# Patient Record
Sex: Female | Born: 1980 | Race: White | Hispanic: No | Marital: Married
Health system: Southern US, Community
[De-identification: ages and names within clinical notes are randomized; demographics above are authoritative.]

---

## 2015-05-17 ENCOUNTER — Other Ambulatory Visit (HOSPITAL_COMMUNITY): Payer: Self-pay | Admitting: Unknown Physician Specialty

## 2015-05-17 DIAGNOSIS — O1210 Gestational proteinuria, unspecified trimester: Secondary | ICD-10-CM

## 2015-05-18 ENCOUNTER — Encounter (HOSPITAL_COMMUNITY): Payer: Self-pay | Admitting: Unknown Physician Specialty

## 2015-05-25 ENCOUNTER — Other Ambulatory Visit (HOSPITAL_COMMUNITY): Payer: Self-pay | Admitting: Unknown Physician Specialty

## 2015-05-25 DIAGNOSIS — O1210 Gestational proteinuria, unspecified trimester: Secondary | ICD-10-CM

## 2015-05-25 DIAGNOSIS — O09512 Supervision of elderly primigravida, second trimester: Secondary | ICD-10-CM

## 2015-05-25 DIAGNOSIS — Z3A19 19 weeks gestation of pregnancy: Secondary | ICD-10-CM

## 2015-05-25 DIAGNOSIS — Z3689 Encounter for other specified antenatal screening: Secondary | ICD-10-CM

## 2015-05-26 ENCOUNTER — Encounter (HOSPITAL_COMMUNITY): Payer: Self-pay

## 2015-05-26 ENCOUNTER — Other Ambulatory Visit (HOSPITAL_COMMUNITY): Payer: Self-pay | Admitting: *Deleted

## 2015-05-26 ENCOUNTER — Ambulatory Visit (HOSPITAL_COMMUNITY)
Admission: RE | Admit: 2015-05-26 | Discharge: 2015-05-26 | Disposition: A | Discharge status: Home / Self Care | Payer: BLUE CROSS/BLUE SHIELD | Source: Ambulatory Visit | Attending: Unknown Physician Specialty | Admitting: Unknown Physician Specialty

## 2015-05-26 ENCOUNTER — Other Ambulatory Visit (HOSPITAL_COMMUNITY): Payer: Self-pay | Admitting: Obstetrics and Gynecology

## 2015-05-26 DIAGNOSIS — O09292 Supervision of pregnancy with other poor reproductive or obstetric history, second trimester: Secondary | ICD-10-CM

## 2015-05-26 DIAGNOSIS — O1210 Gestational proteinuria, unspecified trimester: Secondary | ICD-10-CM

## 2015-05-26 DIAGNOSIS — Z315 Encounter for genetic counseling: Secondary | ICD-10-CM | POA: Insufficient documentation

## 2015-05-26 DIAGNOSIS — Z3A19 19 weeks gestation of pregnancy: Secondary | ICD-10-CM | POA: Insufficient documentation

## 2015-05-26 DIAGNOSIS — Z36 Encounter for antenatal screening of mother: Secondary | ICD-10-CM | POA: Insufficient documentation

## 2015-05-26 DIAGNOSIS — O09529 Supervision of elderly multigravida, unspecified trimester: Secondary | ICD-10-CM | POA: Insufficient documentation

## 2015-05-26 DIAGNOSIS — O09512 Supervision of elderly primigravida, second trimester: Secondary | ICD-10-CM

## 2015-05-26 DIAGNOSIS — O99210 Obesity complicating pregnancy, unspecified trimester: Secondary | ICD-10-CM

## 2015-05-26 DIAGNOSIS — O09522 Supervision of elderly multigravida, second trimester: Secondary | ICD-10-CM | POA: Diagnosis not present

## 2015-05-26 DIAGNOSIS — O34219 Maternal care for unspecified type scar from previous cesarean delivery: Secondary | ICD-10-CM | POA: Diagnosis not present

## 2015-05-26 DIAGNOSIS — O99212 Obesity complicating pregnancy, second trimester: Secondary | ICD-10-CM | POA: Diagnosis not present

## 2015-05-26 DIAGNOSIS — Z3689 Encounter for other specified antenatal screening: Secondary | ICD-10-CM

## 2015-05-26 MED ORDER — ASPIRIN EC 81 MG PO TBEC: 81.0000 mg | DELAYED_RELEASE_TABLET | Freq: Every day | ORAL | Status: AC

## 2015-05-26 NOTE — Progress Notes (Signed)
Genetic Counseling  High-Risk Gestation Note  Appointment Date:  05/26/2015 Referred By: Ernestina Penna, MD Date of Birth:  [birth year] Partner:  Tara Russell   Pregnancy History: [id number] Estimated Date of Delivery: 10/14/15 Estimated Gestational Age: [redacted]w[redacted]d Attending: Damaris Hippo, MD    Tara Russell, and her husband, Tara Russell, were seen for genetic counseling regarding a maternal age of 35 y.o..  In Summary:   Reviewed maternal age-related risk for fetal aneuploidy  Quad screen previously performed and within normal limits  Patient declined NIPS and amniocentesis  Detailed ultrasound performed today; Visualized fetal anatomy within normal limits. Complete report under separate cover  Family history significant for congenital heart disease for father of the pregnancy's maternal half-sister; Recurrence risk is approximately general population risk for the pregnancy given the reported family history.   MFM consultation today; See separate MFM note  They were counseled regarding maternal age and the association with risk for chromosome conditions due to nondisjunction with aging of the ova.   We reviewed chromosomes, nondisjunction, and the associated 1 in 111 risk for fetal aneuploidy related to a maternal age of 35 y.o. at [redacted]w[redacted]d weeks gestation.  They were counseled that the risk for aneuploidy decreases as gestational age increases, accounting for those pregnancies which spontaneously abort.  We specifically discussed Down syndrome (trisomy 62), trisomies 41 and 40, and sex chromosome aneuploidies (47,XXX and 47,XXY) including the common features and prognoses of each.   We also reviewed Tara Russell's maternal serum Quad screen result and the associated reduction in risks for fetal Down syndrome (1 in 1,416), trisomy 18, and ONTDs (1 in 10,000).  They understand that Quad screening provides a pregnancy specific risk for Down syndrome, but is not considered to  be diagnostic.  They were counseled regarding other available screening and diagnostic options including ultrasound, noninvasive prenatal screening (NIPS)/cell free DNA testing, and amniocentesis.  The risks, benefits, and limitations of each of these options were briefly reviewed.    Detailed ultrasound was performed today. Visualized fetal anatomy was within normal limits. Complete ultrasound results reported separately. Tara Russell declined NIPS and amniocentesis, stating she was not interested in additional testing for chromosome conditions during the pregnancy. They understand that screening tests cannot rule out all birth defects or genetic syndromes.  The patient was advised of this limitation and states she still does not want diagnostic testing at this time.   Tara Russell was provided with written information regarding cystic fibrosis (CF) including the carrier frequency and incidence in the Caucasian population, the availability of carrier testing and prenatal diagnosis if indicated.  In addition, we discussed that CF is routinely screened for as part of the [place] newborn screening panel.  She declined CF testing today.   Both family histories were reviewed and found to be contributory for congenital heart disease for the father of the pregnancy's maternal half-sister. 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 She is currently 66 years old, has one child, and is reportedly healthy. Tara Russell reported that his mother smoked during pregnancy with his sister and also worked at a [employer], environmental, so he was unsure what potential exposures may be have been present during that pregnancy. Congenital heart defects occur in approximately 1% of pregnancies.  Congenital heart defects may occur due to multifactorial influences, chromosomal abnormalities, genetic syndromes or environmental exposures.  Isolated heart defects are generally multifactorial.  Given the reported  family history and assuming multifactorial inheritance,  the risk for a congenital heart defect in the current pregnancy (a third degree relative) does not appear to be increased above the general population risk. Without further information regarding the provided family history, an accurate genetic risk cannot be calculated. Further genetic counseling is warranted if more information is obtained.  Tara Russell denied exposure to environmental toxins or chemical agents. She denied the use of alcohol, tobacco or street drugs. She denied significant viral illnesses during the course of her pregnancy. Her medical and surgical histories were contributory for proteinuria in the pregnancy. She was seen for MFM consultation today to discuss this history. See separate MFM consult note for detailed discussion.    I counseled this couple regarding the above risks and available options.  The approximate face-to-face time with the genetic counselor was 40 minutes. Most of the counseling was provided by Azucena Freed, [employer] genetic counseling student, under my direct supervision.   Tara Plowman, MS Certified Genetic Counselor 05/26/2015

## 2015-05-26 NOTE — Progress Notes (Signed)
MFM consultation, Staff Note:  ? Hx of preeclampsia: I spoke to her regarding her pregnancy history of preeclampsia. I explained to her that her obstetrical history places her at increased risk for recurrence of preeclampsia. I explained the syndrome of preeclampsia as being unique to pregnancy and the associated clinical triad of increased blood pressure, proteinuria and abnormal edema. I also reviewed the underlying pathophysiology of preeclampsia as being rooted in endothelial dysfunction. I explained to her the increased vascular reactivity as well as the leaky endothelial lining of the blood vessels that create an environment of uteroplacental insufficiency with increased risk of intrauterine growth restriction, oligohydramnios and stillbirth. I cited at least a 25% recurrence risk if not higher to her for the spectrum of severe preeclampsia.  Given her baseline 24 hour urine for protein and creatinine clearance demonstrating  proteinuria, she likely has a persistent microvascular changes that would be seen on renal biopsy that resulted from preeclampsia in the last pregnancy.  However, there is no justification at this point to recommend renal biopsy.  Rather, she is at higher risk and has a lower threshold for going into the preeclamptic pathway given history and proteinuria.  I would watch for development of HTN in the mid and late trimesters as she will then have preeclampsia and need weekly labs, antenatal testing, and delivery by 37 weeks.   I explained to her there is actually a preventive medicine to possibly reduce incidence of recurrence and subsequent pregnancy affected by severe preeclampsia. This medication is called low-dose aspirin 81 mg tablet taken daily. It is important that this preventive therapy be started prior to 20 weeks in this pregnancyand preferably by 16 weeks. Given that she is [redacted]w[redacted]d, I prescribed aspirin 81 mg po qd for her today. This should be discontinued 2 weeks  prior to anticipated delivery or 37 weeks, whichever comes first.  ? Recommendations  1. Baseline preeclampsia labs to be repeated at 28 weeks (ie, not for diagnosis of preeclampsia but for monitoring of renal function as she is now at risk for renal deterioration with physiologic changes required to sustain pregnancy  2. ASA 81 mg po qd  3. Routine surveillance in mid and late trimester for onset of preeclampsia, noting we would be happy to reconsult in setting of suspected or unclear diagnosis of preeclampsia.  4. Given obesity, recommend 11-20 pounds of weight gain in pregnancy and interval growth ultrasounds q6-8 weeks for fetal growth surveillance.?  5. Consider TSH and HgbA1c screening along with early 1 hour GTT.  6. Provided the patient remains normotensive and fetal growth is normal, recommend delivery at 39 weeks  7. If HTN develops, she meets criteria for preeclampsia and should be managed as such with antenatal testing, weekly labs, and delivery at 37 weeks for mild preeclampsia and 34 weeks for severe preeclampsia.  Time Spent:  I spent in excess of 60 minutes in consultation with this patient to review records, evaluate her case, and provide her with an adequate discussion and education. More than 50% of this time was spent in direct face-to-face counseling.  ? It was a pleasure seeing your patient in the office today. Thank you for consultation. Please do not hesitate to contact our service for any further questions.  ?  Rogelia Boga, MD, MS, FACOG  Assistant Professor  Section of Maternal-Fetal Medicine  [facility]

## 2015-05-26 NOTE — Consult Note (Signed)
MFM consultation, Staff Note:  ? Hx of preeclampsia: I spoke to her regarding her pregnancy history of preeclampsia. I explained to her that her obstetrical history places her at increased risk for recurrence of preeclampsia. I explained the syndrome of preeclampsia as being unique to pregnancy and the associated clinical triad of increased blood pressure, proteinuria and abnormal edema. I also reviewed the underlying pathophysiology of preeclampsia as being rooted in endothelial dysfunction. I explained to her the increased vascular reactivity as well as the leaky endothelial lining of the blood vessels that create an environment of uteroplacental insufficiency with increased risk of intrauterine growth restriction, oligohydramnios and stillbirth. I cited at least a 25% recurrence risk if not higher to her for the spectrum of severe preeclampsia.  Given her baseline 24 hour urine for protein and creatinine clearance demonstrating 423mg proteinuria, she likely has a persistent microvascular changes that would be seen on renal biopsy that resulted from preeclampsia in the last pregnancy.  However, there is no justification at this point to recommend renal biopsy.  Rather, she is at higher risk and has a lower threshold for going into the preeclamptic pathway given history and proteinuria.  I would watch for development of HTN in the mid and late trimesters as she will then have preeclampsia and need weekly labs, antenatal testing, and delivery by 37 weeks.   I explained to her there is actually a preventive medicine to possibly reduce incidence of recurrence and subsequent pregnancy affected by severe preeclampsia. This medication is called low-dose aspirin 81 mg tablet taken daily. It is important that this preventive therapy be started prior to 20 weeks in this pregnancyand preferably by 16 weeks. Given that she is [redacted]w[redacted]d, I prescribed aspirin 81 mg po qd for her today. This should be discontinued 2 weeks  prior to anticipated delivery or 37 weeks, whichever comes first.  ? Recommendations  1. Baseline preeclampsia labs to be repeated at 28 weeks (ie, not for diagnosis of preeclampsia but for monitoring of renal function as she is now at risk for renal deterioration with physiologic changes required to sustain pregnancy  2. ASA 81 mg po qd  3. Routine surveillance in mid and late trimester for onset of preeclampsia, noting we would be happy to reconsult in setting of suspected or unclear diagnosis of preeclampsia.  4. Given obesity, recommend 11-20 pounds of weight gain in pregnancy and interval growth ultrasounds q6-8 weeks for fetal growth surveillance.?  5. Consider TSH and HgbA1c screening along with early 1 hour GTT.  6. Provided the patient remains normotensive and fetal growth is normal, recommend delivery at 39 weeks  7. If HTN develops, she meets criteria for preeclampsia and should be managed as such with antenatal testing, weekly labs, and delivery at 37 weeks for mild preeclampsia and 34 weeks for severe preeclampsia.  Time Spent:  I spent in excess of 60 minutes in consultation with this patient to review records, evaluate her case, and provide her with an adequate discussion and education. More than 50% of this time was spent in direct face-to-face counseling.  ? It was a pleasure seeing your patient in the office today. Thank you for consultation. Please do not hesitate to contact our service for any further questions.  ?  Tuyen Uncapher Morgan, MD, MS, FACOG  Assistant Professor  Section of Maternal-Fetal Medicine  [employer] 

## 2015-07-21 ENCOUNTER — Encounter (HOSPITAL_COMMUNITY): Payer: Self-pay

## 2015-07-21 ENCOUNTER — Ambulatory Visit (HOSPITAL_COMMUNITY)
Admission: RE | Admit: 2015-07-21 | Discharge: 2015-07-21 | Disposition: A | Discharge status: Home / Self Care | Payer: BLUE CROSS/BLUE SHIELD | Source: Ambulatory Visit | Attending: Unknown Physician Specialty | Admitting: Unknown Physician Specialty

## 2015-07-21 VITALS — BP 110/68 | HR 96 | Wt 293.1 lb

## 2015-07-21 DIAGNOSIS — O09292 Supervision of pregnancy with other poor reproductive or obstetric history, second trimester: Secondary | ICD-10-CM | POA: Insufficient documentation

## 2015-07-21 DIAGNOSIS — O09522 Supervision of elderly multigravida, second trimester: Secondary | ICD-10-CM | POA: Insufficient documentation

## 2015-07-21 DIAGNOSIS — Z36 Encounter for antenatal screening of mother: Secondary | ICD-10-CM | POA: Insufficient documentation

## 2015-07-21 DIAGNOSIS — O1210 Gestational proteinuria, unspecified trimester: Secondary | ICD-10-CM | POA: Diagnosis not present

## 2015-07-21 DIAGNOSIS — O09529 Supervision of elderly multigravida, unspecified trimester: Secondary | ICD-10-CM

## 2015-07-21 DIAGNOSIS — Z3A27 27 weeks gestation of pregnancy: Secondary | ICD-10-CM | POA: Insufficient documentation

## 2015-07-21 DIAGNOSIS — O99212 Obesity complicating pregnancy, second trimester: Secondary | ICD-10-CM | POA: Diagnosis not present

## 2015-07-21 DIAGNOSIS — O34219 Maternal care for unspecified type scar from previous cesarean delivery: Secondary | ICD-10-CM | POA: Insufficient documentation

## 2015-07-21 DIAGNOSIS — O99210 Obesity complicating pregnancy, unspecified trimester: Secondary | ICD-10-CM

## 2016-03-30 ENCOUNTER — Encounter (HOSPITAL_COMMUNITY): Payer: Self-pay

## 2016-09-20 DEATH — deceased

## 2016-10-13 IMAGING — US US MFM OB DETAIL+14 WK
1 series · 14 of 28 positions shown · non-contrast
Comparison: none

[Series 1: us mfm ob detail+14 wk · 51 acquisitions, 14 frames shown]
[im 2/51]
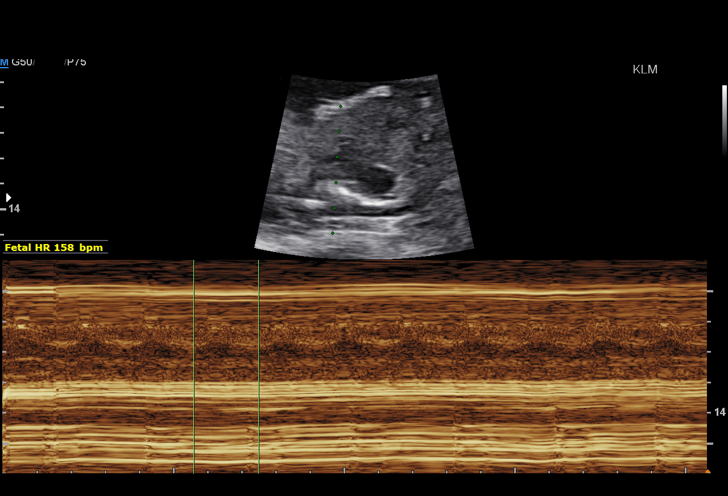
[im 6/51]
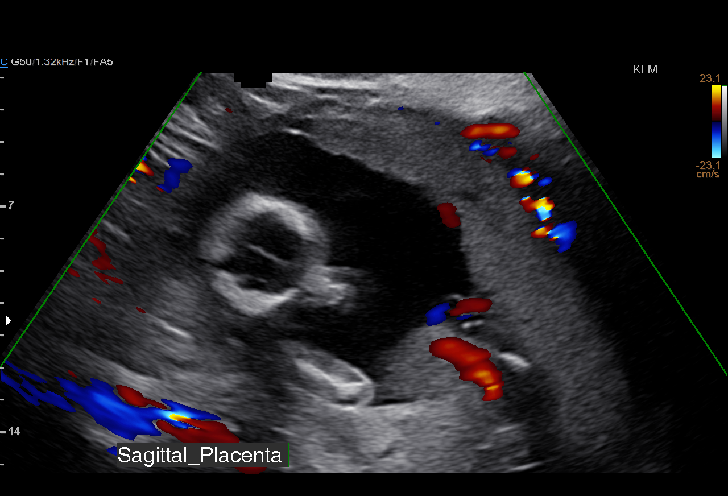
[im 10/51]
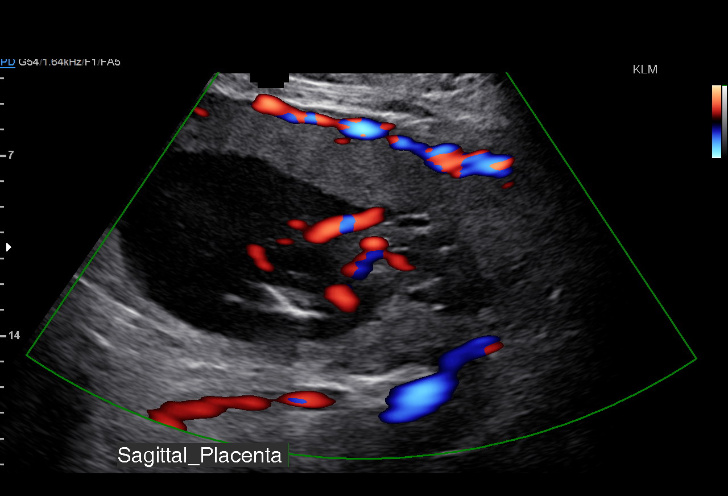
[im 13/51]
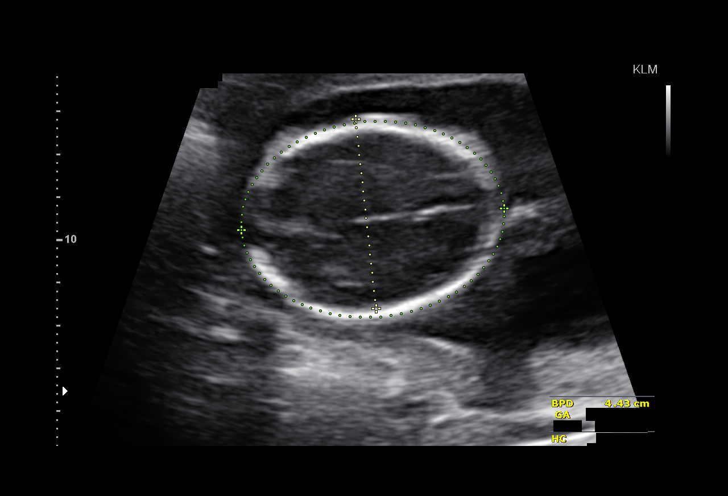
[im 17/51]
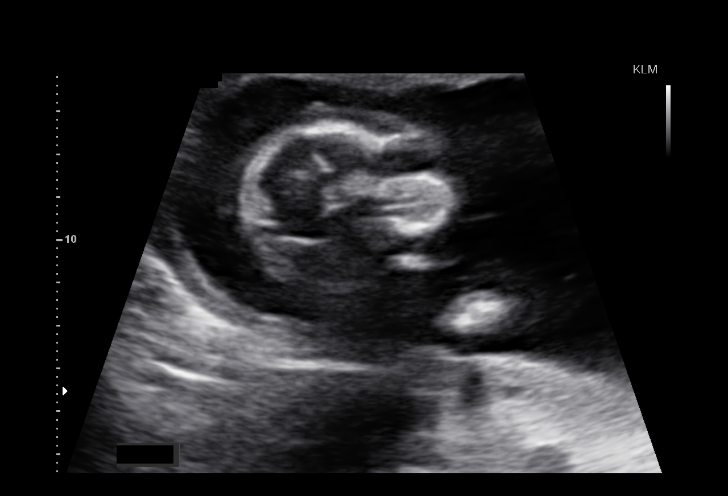
[im 21/51]
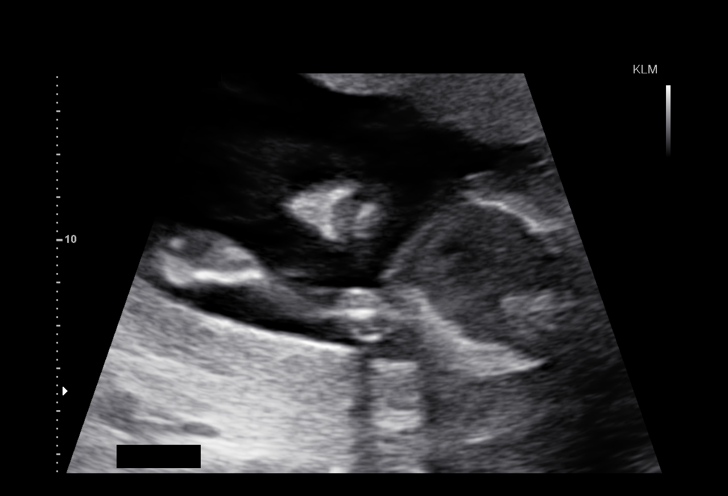
[im 25/51]
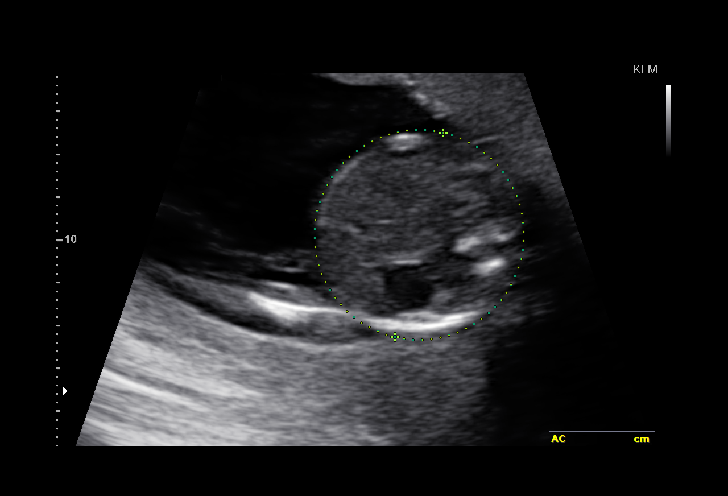
[im 28/51]
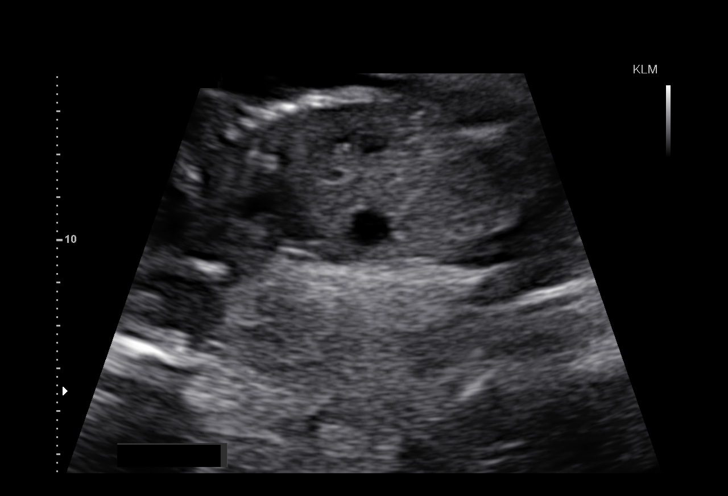
[im 32/51]
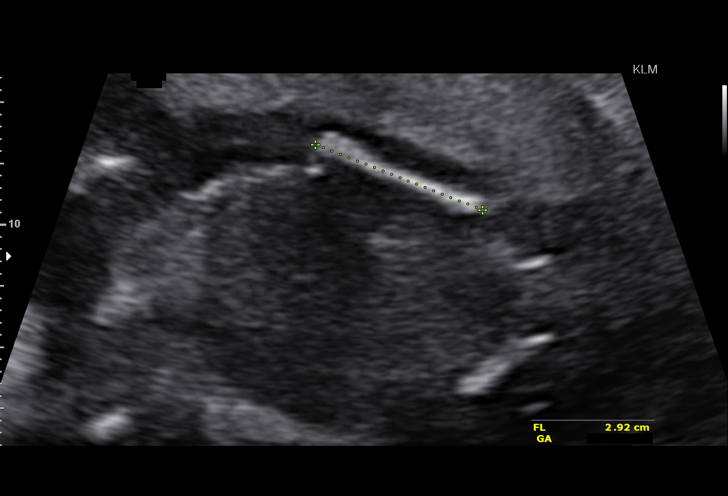
[im 36/51]
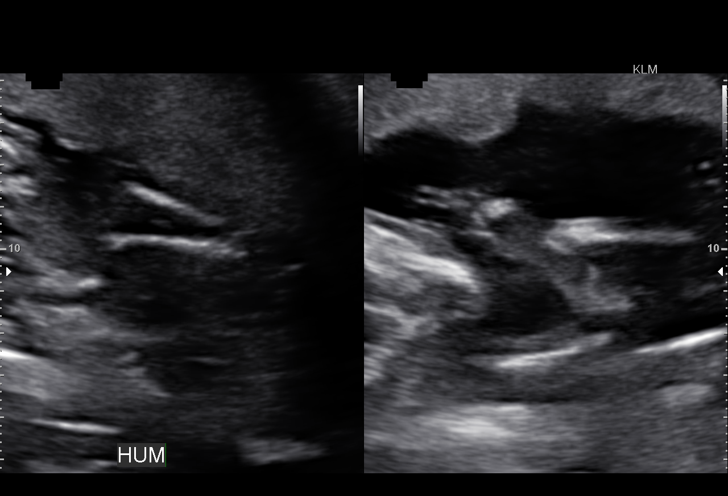
[im 39/51]
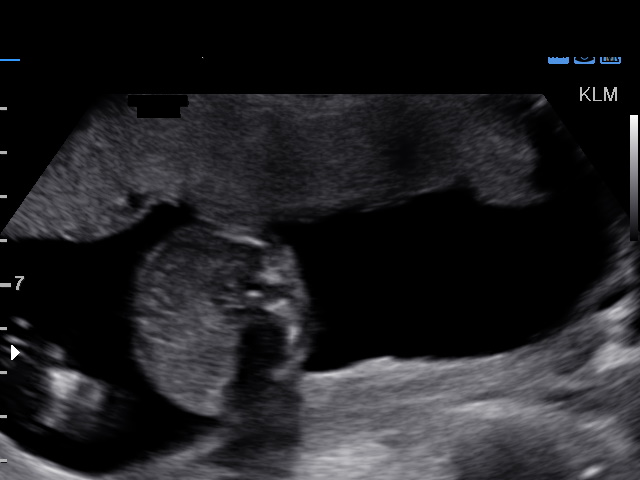
[im 43/51]
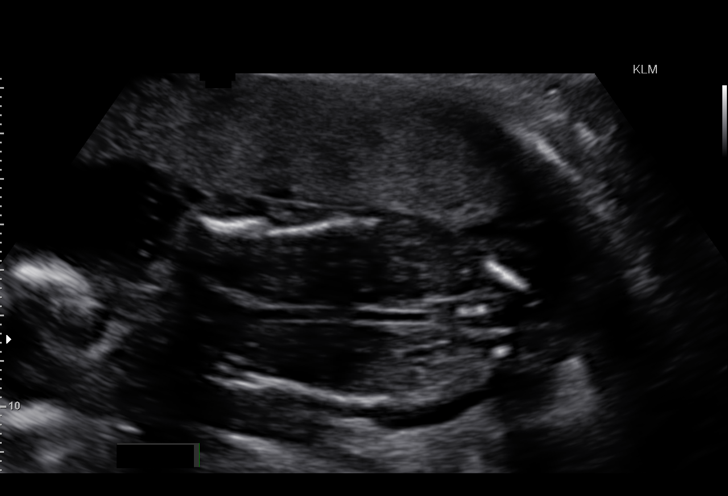
[im 47/51]
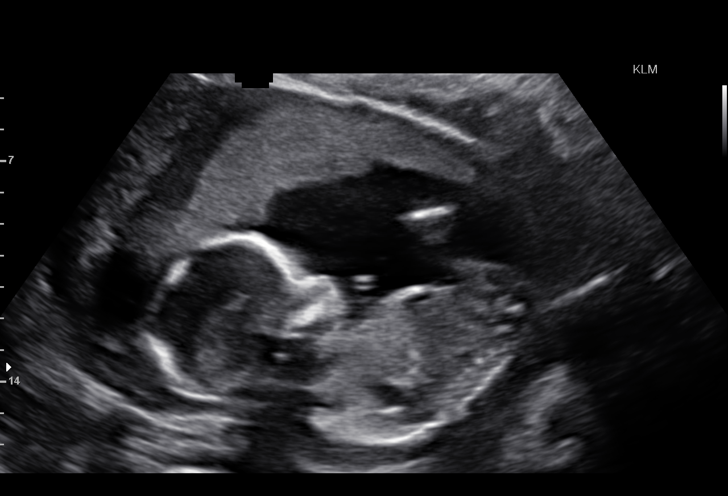
[im 51/51]
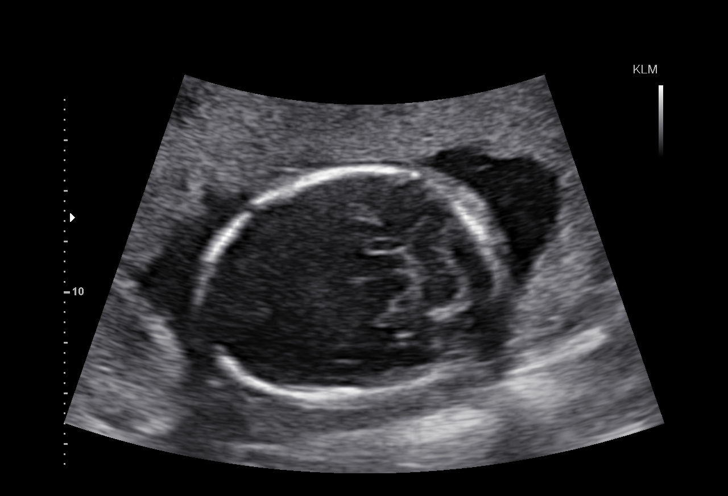

[14 of 28 positions shown; findings below may reference images not displayed]

pm)

Centre
[address],

1  KRISTINE JOYCE APULI              [id number]      [id number]     [id number]
Indications

Detailed fetal anatomic survey                 Z36
Obesity complicating pregnancy, second
trimester
Proteinuria in pregnancy, antepartum
Poor obstetric history: Previous
preeclampsia / eclampsia/gestational HTN
Poor obstetric history: Previous gestational
diabetes
Advanced maternal age multigravida 35+,
second trimester
Previous cesarean delivery, antepartum
19 weeks gestation of pregnancy
OB History

Gravidity:    2         Term:   1        Prem:   0        SAB:   0
TOP:          0       Ectopic:  0        Living: 1
Fetal Evaluation

Num Of Fetuses:     1
Fetal Heart         158
Rate(bpm):
Cardiac Activity:   Observed
Presentation:       Breech
Placenta:           Anterior, above cervical os
P. Cord Insertion:  Visualized
Amniotic Fluid
AFI FV:      Subjectively within normal limits
Larg Pckt:     5.3  cm
Biometry

BPD:      44.4  mm     G. Age:  19w 3d                  CI:        70.99   %    70 - 86
FL/HC:      17.4   %    16.8 -
HC:      167.9  mm     G. Age:  19w 3d         24  %    HC/AC:      1.10        1.09 -
AC:      153.2  mm     G. Age:  20w 4d         66  %    FL/BPD:     65.8   %
FL:       29.2  mm     G. Age:  19w 0d         16  %    FL/AC:      19.1   %    20 - 24
HUM:      30.9  mm     G. Age:  20w 2d         63  %

Est. FW:     312  gm    0 lb 11 oz      47  %
Gestational Age

LMP:           19w 6d        Date:  01/07/15                 EDD:   10/14/15
U/S Today:     19w 4d                                        EDD:   10/16/15
Best:          19w 6d     Det. By:  LMP  (01/07/15)          EDD:   10/14/15
Anatomy

Cranium:          Appears normal         Aortic Arch:      Not well visualized
Fetal Cavum:      Appears normal         Ductal Arch:      Not well visualized
Ventricles:       Appears normal         Diaphragm:        Appears normal
Choroid Plexus:   Appears normal         Stomach:          Appears normal, left
sided
Cerebellum:       Appears normal         Abdomen:          Appears normal
Posterior Fossa:  Not well visualized    Abdominal Wall:   Appears nml (cord
insert, abd wall)
Nuchal Fold:      Not well visualized    Cord Vessels:     Appears normal (3
vessel cord)
Face:             Orbits nl; profile not Kidneys:          Appear normal
well visualized
Lips:             Appears normal         Bladder:          Appears normal
Fetal Thoracic:   Appears normal         Spine:            Not well visualized
Heart:            Not well visualized    Upper             Appears normal
Extremities:
RVOT:             Not well visualized    Lower             Appears normal
Extremities:
LVOT:             Not well visualized

Other:  Fetus appears to be a female. Technically difficult due to maternal
habitus and fetal position.
Cervix Uterus Adnexa

Cervix
Length:            3.8  cm.
Normal appearance by transabdominal scan.
Impression

SIUP at 82w7d
EFW 47th%'le
no dysmorphic features, limitations as above
normal AFI
no previa
Recommendations

1. interval growth q6-8 weeks
2. see MFM consultation

## 2016-12-08 IMAGING — US US MFM OB FOLLOW-UP
1 series · 14 of 28 positions shown · non-contrast
Comparison: none

[Series 1: us mfm ob follow-up · 14 of 90 slices shown]
[im 4/90]
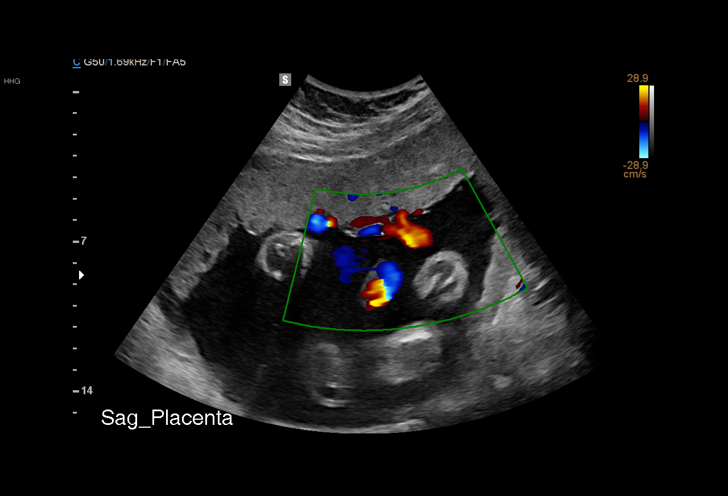
[im 10/90]
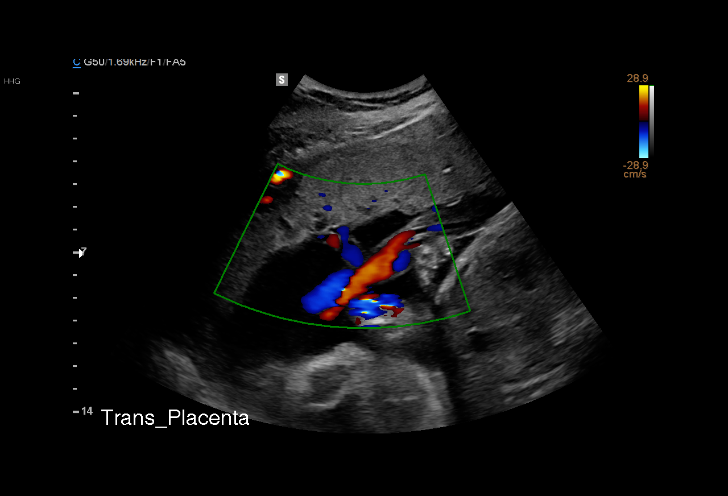
[im 17/90]
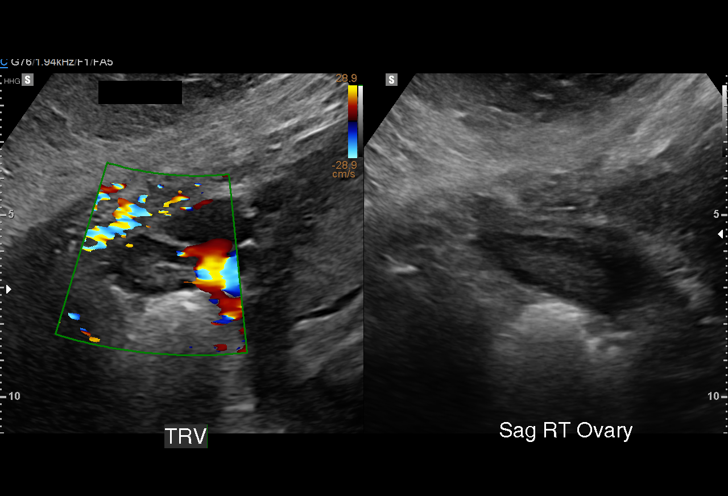
[im 24/90]
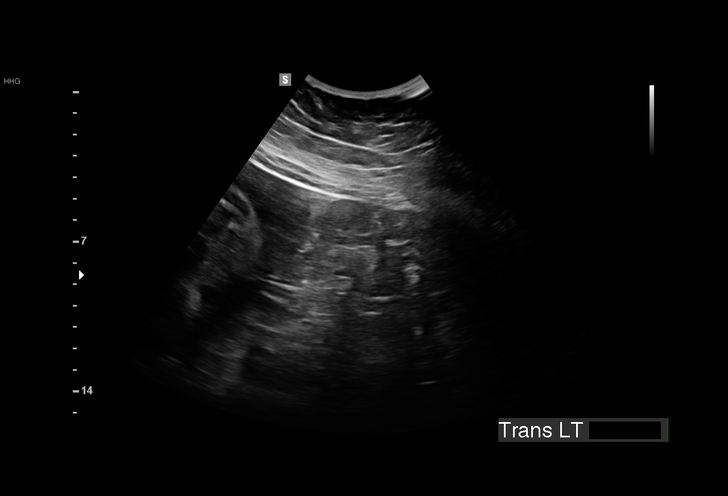
[im 30/90]
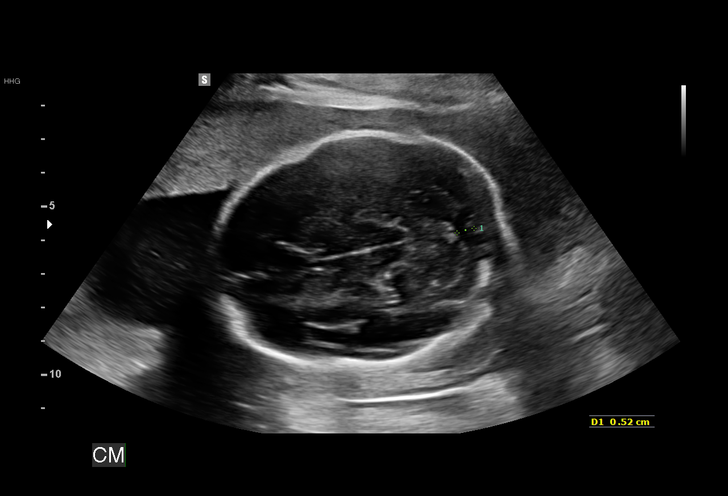
[im 37/90]
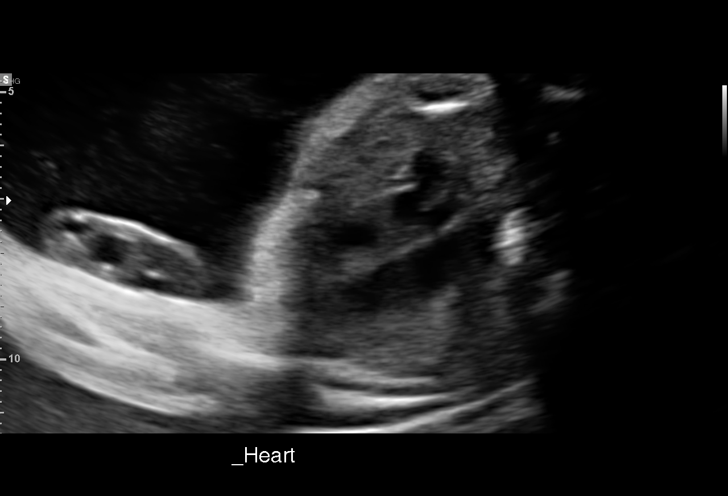
[im 43/90]
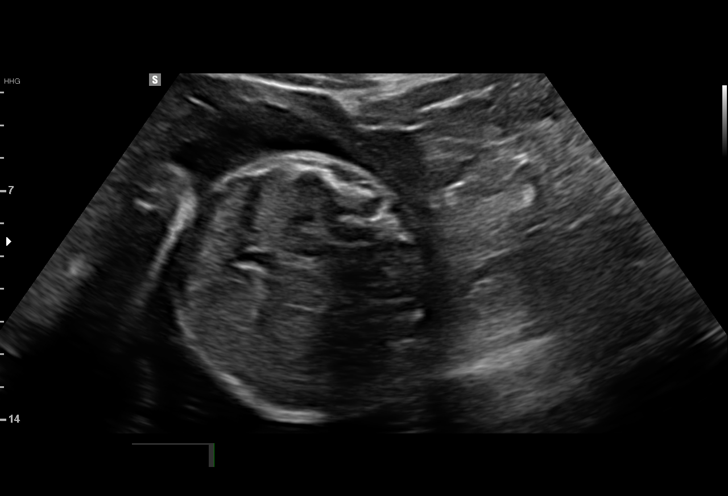
[im 50/90]
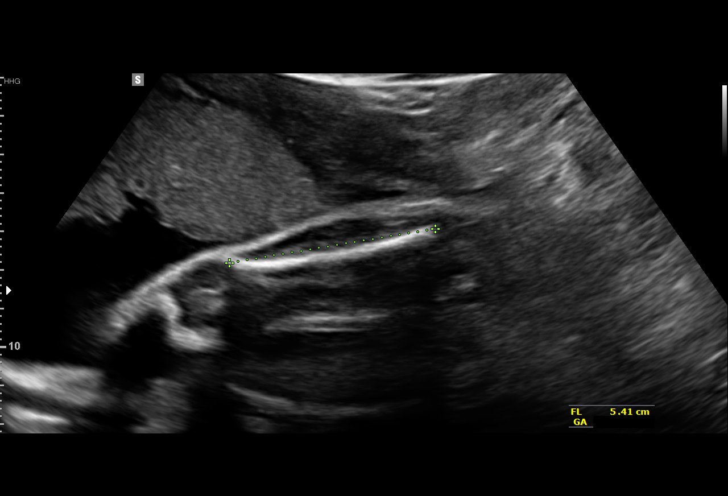
[im 57/90]
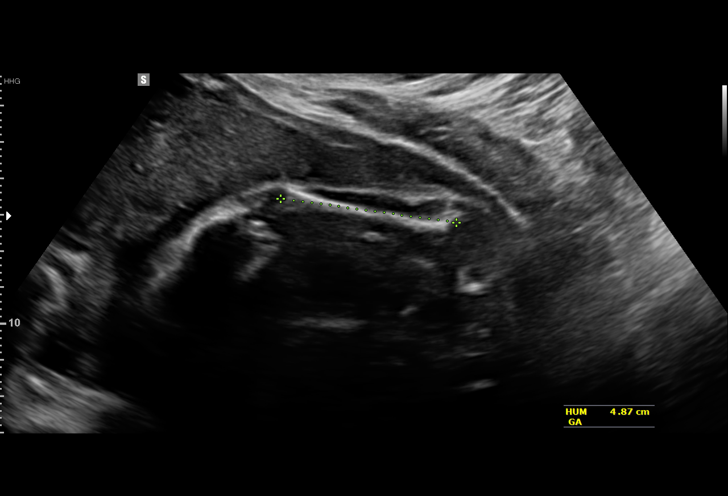
[im 63/90]
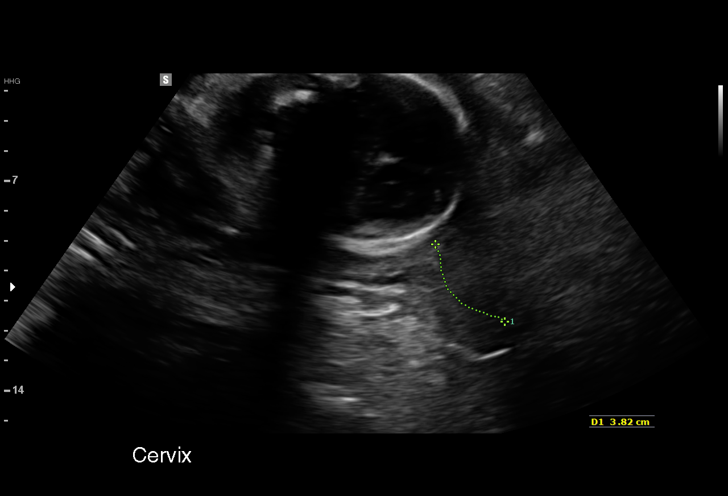
[im 70/90]
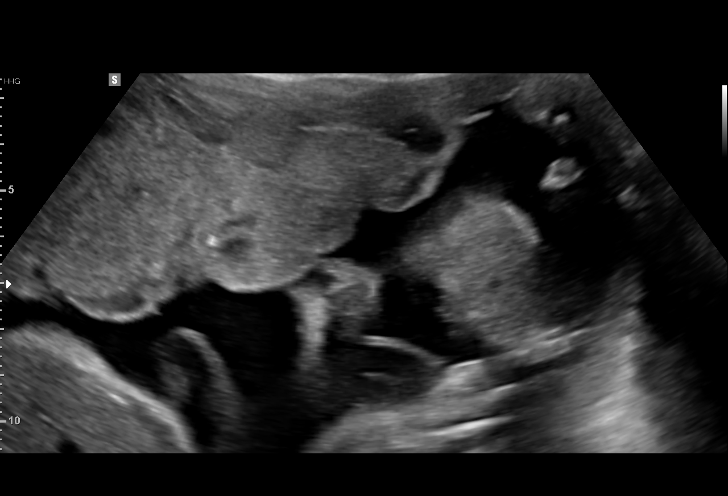
[im 76/90]
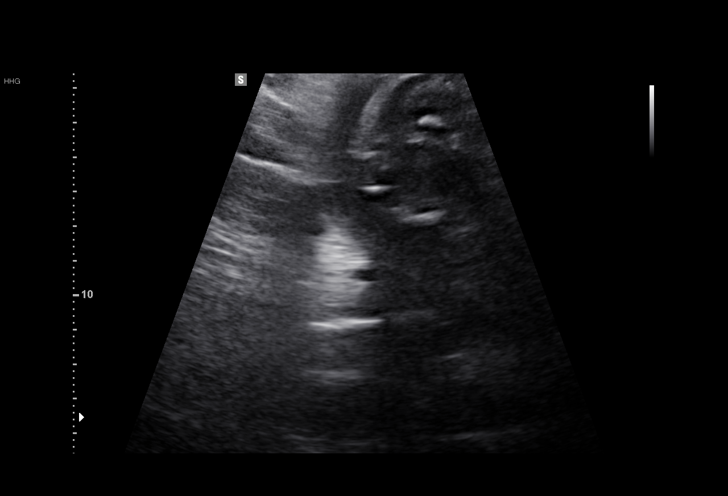
[im 83/90]
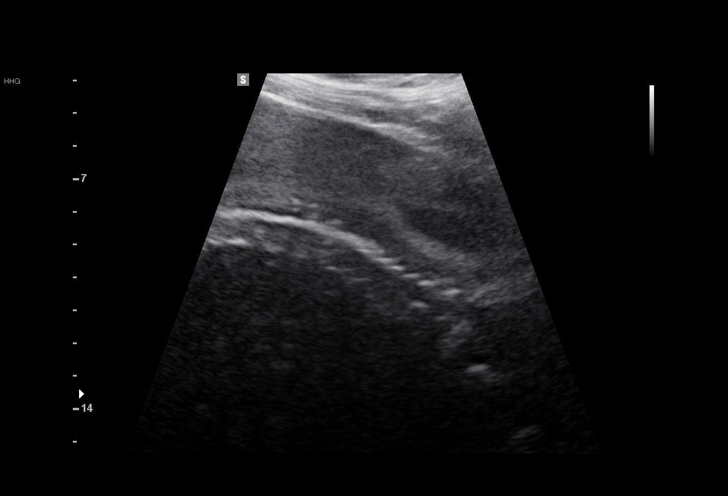
[im 90/90]
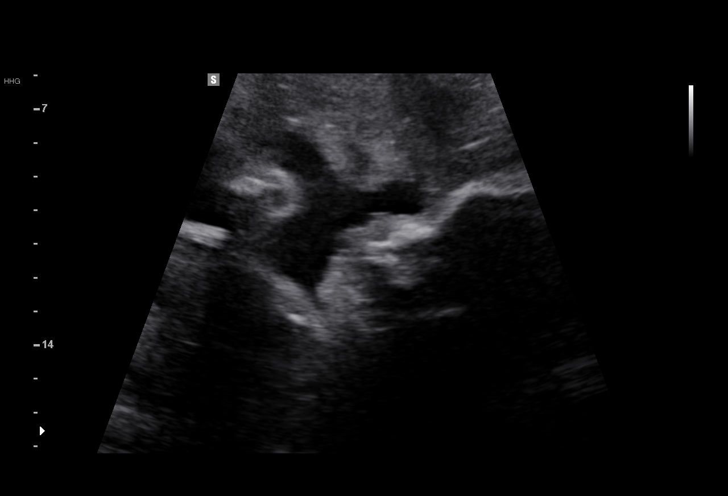

[14 of 28 positions shown; findings below may reference images not displayed]

Centre
[address],

Indications

27 weeks gestation of pregnancy
Obesity complicating pregnancy, second
trimester
Proteinuria in pregnancy, antepartum
Poor obstetric history: Previous
preeclampsia / eclampsia/gestational
HTN/gestational diabetes
Advanced maternal age multigravida 35+,
second trimester
Previous cesarean delivery, antepartum
Evaluate anatomy not seen on prior             Z36
sonogram
OB History

Gravidity:    2         Term:   1        Prem:   0         SAB:   0
TOP:          0       Ectopic:  0        Living: 1
Fetal Evaluation

Num Of Fetuses:     1
Fetal Heart         153
Rate(bpm):
Cardiac Activity:   Observed
Presentation:       Cephalic
Placenta:           Anterior, above cervical os
P. Cord Insertion:  Visualized, central

Amniotic Fluid
AFI FV:      Subjectively, upper limites of normal
AFI Sum:     23.77    cm      96  %Tile      Larg Pckt:   7.26  cm
RUQ:   5.62    cm   RLQ:    7.26   cm    LUQ:   4.95    cm    LLQ:   5.94    cm
Biometry

BPD:      70.6  mm     G. Age:  28w 2d                  CI:         74.11  %    70 - 86
FL/HC:       21.0  %    18.8 -
HC:      260.4  mm     G. Age:  28w 2d         34  %    HC/AC:       1.00       1.05 -
AC:      261.3  mm     G. Age:  30w 2d         95  %    FL/BPD:      77.5  %    71 - 87
FL:       54.7  mm     G. Age:  28w 6d         66  %    FL/AC:       20.9  %    20 - 24
HUM:      48.5  mm     G. Age:  28w 4d         59  %

Est. FW:    2620   gm     3 lb 1 oz     79  %
Gestational Age

LMP:           27w 6d        Date:  01/07/15                 EDD:    10/14/15
U/S Today:     29w 0d                                        EDD:    10/06/15
Best:          27w 6d     Det. By:  LMP  (01/07/15)          EDD:    10/14/15
Anatomy

Cranium:          Appears normal         Aortic Arch:      Appears normal
Fetal Cavum:      Appears normal         Ductal Arch:      Appears normal
Ventricles:       Appears normal         Diaphragm:        Appears normal
Choroid Plexus:   Appears normal         Stomach:          Appears normal, left
sided
Cerebellum:       Appears normal         Abdomen:          Appears normal
Posterior Fossa:  Appears normal         Abdominal Wall:   Appears nml (cord
insert, abd wall)
Nuchal Fold:      Not applicable (>20    Cord Vessels:     Previously seen
wks GA)
Face:             Appears normal         Kidneys:          Appear normal
(orbits and profile)
Lips:             Previously seen        Bladder:          Appears normal
Fetal Thoracic:   Appears normal         Spine:            Limited views;
appear normal
Heart:            Appears normal         Upper             Previously seen
(4CH, axis, and        Extremities:
situs)
RVOT:             Appears normal         Lower             Previously seen
Extremities:
LVOT:             Appears normal

Other:  Fetus appears to be a female. Nasal bone visualized. Technically
difficult due to maternal habitus.
Cervix Uterus Adnexa

Cervix
Length:            3.8  cm.

Uterus
No abnormality visualized.

Left Ovary
Not visualized.

Right Ovary
Size(cm)         4  x   1.8    x  2.6       Vol(ml):
Within normal limits.

Adnexa:       No adnexal mass visualized.
Impression

SIUP at 27+6 weeks
Normal interval anatomy; anatomic survey complete
Normal amniotic fluid volume
Appropriate interval growth with EFW at the 79th %tile
Recommendations

Continue serial ultrasounds for growth (pt would like these
locally)
